# Patient Record
Sex: Female | Born: 2000 | Race: Black or African American | Hispanic: No | Marital: Single | Smoking: Never smoker
Health system: Southern US, Community
[De-identification: ages and names within clinical notes are randomized; demographics above are authoritative.]

## PROBLEM LIST (undated history)

## (undated) DIAGNOSIS — F419 Anxiety disorder, unspecified: Secondary | ICD-10-CM

## (undated) DIAGNOSIS — R519 Headache, unspecified: Secondary | ICD-10-CM

## (undated) DIAGNOSIS — F32A Depression, unspecified: Secondary | ICD-10-CM

## (undated) DIAGNOSIS — R51 Headache: Secondary | ICD-10-CM

## (undated) HISTORY — DX: Anxiety disorder, unspecified: F41.9

## (undated) HISTORY — DX: Depression, unspecified: F32.A

## (undated) HISTORY — PX: NO PAST SURGERIES: SHX2092

---
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
## 2019-01-02 ENCOUNTER — Ambulatory Visit: Payer: Self-pay | Admitting: Ophthalmology

## 2019-01-02 ENCOUNTER — Encounter (HOSPITAL_BASED_OUTPATIENT_CLINIC_OR_DEPARTMENT_OTHER)
Admission: RE | Admit: 2019-01-02 | Discharge: 2019-01-02 | Disposition: A | Discharge status: Home / Self Care | Payer: No Typology Code available for payment source | Source: Ambulatory Visit | Attending: Ophthalmology | Admitting: Ophthalmology

## 2019-01-02 DIAGNOSIS — H0015 Chalazion left lower eyelid: Secondary | ICD-10-CM | POA: Diagnosis present

## 2019-01-02 DIAGNOSIS — Z01812 Encounter for preprocedural laboratory examination: Secondary | ICD-10-CM

## 2019-01-02 NOTE — H&P (Signed)
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 Headache     Pertinent ocular history: 18 y.o. female with chalazion left eye x24mos. Has not resolved with warm compresses or topical antibiotics. Patient able to resolve other chalaziae but not this one. Strongly wishes for excision but refuses to have done in clinic, extremely anxious about needles and would "not be able to hold still" for numbing.  Pertinent family history:  Family History  Problem Relation Age of Onset  . Migraines Mother   . Headache Father   . Alcohol abuse Maternal Grandmother   . Alcohol abuse Paternal Grandfather   . Seizures Neg Hx   . Depression Neg Hx   . Anxiety disorder Neg Hx   . Bipolar disorder Neg Hx   . Schizophrenia Neg Hx   . ADD / ADHD Neg Hx   . Autism Neg Hx   . Drug abuse Neg Hx     General:  Healthy appearing patient in no distress.   Eyes:    Acuity OD 20/15  OS 20/20   [place]  External: Within normal limits     Anterior segment: 75mm hard nodule on left lower eyelid  Motility: Full EOMs OU  Fundus: Normal         Impression: 18 y.o. female with chalazion left eye resistant to medical management  Plan: Chalazion excision left eye  French Ana

## 2019-01-03 LAB — POCT PREGNANCY, URINE: Preg Test, Ur: NEGATIVE

## 2019-01-04 NOTE — Progress Notes (Signed)
Anesthesia consult per Dr. Carignan, will proceed with surgery as scheduled. 

## 2019-01-05 ENCOUNTER — Encounter (HOSPITAL_BASED_OUTPATIENT_CLINIC_OR_DEPARTMENT_OTHER): Payer: Self-pay

## 2019-01-05 ENCOUNTER — Ambulatory Visit (HOSPITAL_BASED_OUTPATIENT_CLINIC_OR_DEPARTMENT_OTHER): Payer: No Typology Code available for payment source | Admitting: Anesthesiology

## 2019-01-05 ENCOUNTER — Other Ambulatory Visit: Payer: Self-pay

## 2019-01-05 ENCOUNTER — Encounter (HOSPITAL_BASED_OUTPATIENT_CLINIC_OR_DEPARTMENT_OTHER)
Admission: RE | Disposition: A | Discharge status: Home / Self Care | Payer: Self-pay | Source: Home / Self Care | Attending: Ophthalmology

## 2019-01-05 ENCOUNTER — Ambulatory Visit (HOSPITAL_BASED_OUTPATIENT_CLINIC_OR_DEPARTMENT_OTHER)
Admission: RE | Admit: 2019-01-05 | Discharge: 2019-01-05 | Disposition: A | Discharge status: Home / Self Care | Payer: No Typology Code available for payment source | Attending: Ophthalmology | Admitting: Ophthalmology

## 2019-01-05 DIAGNOSIS — H0015 Chalazion left lower eyelid: Secondary | ICD-10-CM | POA: Diagnosis not present

## 2019-01-05 HISTORY — DX: Headache, unspecified: R51.9

## 2019-01-05 HISTORY — DX: Headache: R51

## 2019-01-05 HISTORY — PX: CHALAZION EXCISION: SHX213

## 2019-01-05 SURGERY — EXCISION, CHALAZION
Anesthesia: General | Site: Eye | Laterality: Left

## 2019-01-05 MED ORDER — TRIAMCINOLONE ACETONIDE 40 MG/ML IJ SUSP
INTRAMUSCULAR | Status: AC
  Filled 2019-01-05: qty 5

## 2019-01-05 MED ORDER — MIDAZOLAM HCL 2 MG/2ML IJ SOLN: 1.0000 mg | INTRAMUSCULAR | Status: DC | PRN

## 2019-01-05 MED ORDER — LIDOCAINE 2% (20 MG/ML) 5 ML SYRINGE
INTRAMUSCULAR | Status: AC
  Filled 2019-01-05: qty 25

## 2019-01-05 MED ORDER — NEOMYCIN-POLYMYXIN-DEXAMETH 0.1 % OP OINT: 1.0000 "application " | TOPICAL_OINTMENT | Freq: Three times a day (TID) | OPHTHALMIC | Status: DC

## 2019-01-05 MED ORDER — FENTANYL CITRATE (PF) 100 MCG/2ML IJ SOLN
INTRAMUSCULAR | Status: AC
  Filled 2019-01-05: qty 2

## 2019-01-05 MED ORDER — FENTANYL CITRATE (PF) 100 MCG/2ML IJ SOLN
50.0000 ug | INTRAMUSCULAR | Status: DC | PRN
  Administered 2019-01-05: 25 ug via INTRAVENOUS

## 2019-01-05 MED ORDER — LIDOCAINE-EPINEPHRINE 1 %-1:100000 IJ SOLN
INTRAMUSCULAR | Status: AC
  Filled 2019-01-05: qty 2

## 2019-01-05 MED ORDER — PROPOFOL 500 MG/50ML IV EMUL
INTRAVENOUS | Status: AC
  Filled 2019-01-05: qty 50

## 2019-01-05 MED ORDER — PROPOFOL 10 MG/ML IV BOLUS
INTRAVENOUS | Status: DC | PRN
  Administered 2019-01-05: 150 mg via INTRAVENOUS

## 2019-01-05 MED ORDER — NEOMYCIN-POLYMYXIN-DEXAMETH 3.5-10000-0.1 OP OINT
TOPICAL_OINTMENT | OPHTHALMIC | Status: DC | PRN
  Administered 2019-01-05: 1 via OPHTHALMIC

## 2019-01-05 MED ORDER — SCOPOLAMINE 1 MG/3DAYS TD PT72: 1.0000 | MEDICATED_PATCH | Freq: Once | TRANSDERMAL | Status: DC | PRN

## 2019-01-05 MED ORDER — BSS IO SOLN
INTRAOCULAR | Status: AC
  Filled 2019-01-05: qty 30

## 2019-01-05 MED ORDER — LIDOCAINE-EPINEPHRINE 1 %-1:100000 IJ SOLN
INTRAMUSCULAR | Status: DC | PRN
  Administered 2019-01-05: 1 mL

## 2019-01-05 MED ORDER — NEOMYCIN-POLYMYXIN-DEXAMETH 3.5-10000-0.1 OP OINT
TOPICAL_OINTMENT | OPHTHALMIC | Status: AC
  Filled 2019-01-05: qty 7

## 2019-01-05 MED ORDER — LACTATED RINGERS IV SOLN
INTRAVENOUS | Status: DC
  Administered 2019-01-05: 09:00:00 via INTRAVENOUS

## 2019-01-05 MED ORDER — LIDOCAINE HCL (CARDIAC) PF 100 MG/5ML IV SOSY
PREFILLED_SYRINGE | INTRAVENOUS | Status: DC | PRN
  Administered 2019-01-05: 30 mg via INTRAVENOUS

## 2019-01-05 MED ORDER — ONDANSETRON HCL 4 MG/2ML IJ SOLN
INTRAMUSCULAR | Status: DC | PRN
  Administered 2019-01-05: 4 mg via INTRAVENOUS

## 2019-01-05 MED ORDER — DEXAMETHASONE SODIUM PHOSPHATE 10 MG/ML IJ SOLN
INTRAMUSCULAR | Status: AC
  Filled 2019-01-05: qty 3

## 2019-01-05 MED ORDER — DEXAMETHASONE SODIUM PHOSPHATE 4 MG/ML IJ SOLN
INTRAMUSCULAR | Status: DC | PRN
  Administered 2019-01-05: 4 mg via INTRAVENOUS

## 2019-01-05 MED ORDER — BSS IO SOLN
INTRAOCULAR | Status: DC | PRN
  Administered 2019-01-05: 2 mL

## 2019-01-05 MED ORDER — ONDANSETRON HCL 4 MG/2ML IJ SOLN
INTRAMUSCULAR | Status: AC
  Filled 2019-01-05: qty 12

## 2019-01-05 SURGICAL SUPPLY — 33 items
APL SRG 3 HI ABS STRL LF PLS (MISCELLANEOUS) ×1
APPLICATOR DR MATTHEWS STRL (MISCELLANEOUS) ×3 IMPLANT
BLADE SURG 15 STRL LF DISP TIS (BLADE) ×1 IMPLANT
BLADE SURG 15 STRL SS (BLADE) ×3
BNDG COHESIVE 2X5 TAN STRL LF (GAUZE/BANDAGES/DRESSINGS) IMPLANT
CAUTERY EYE LOW TEMP 1300F FIN (OPHTHALMIC RELATED) IMPLANT
CORD BIPOLAR FORCEPS 12FT (ELECTRODE) ×3 IMPLANT
COVER MAYO STAND STRL (DRAPES) ×3 IMPLANT
COVER SURGICAL LIGHT HANDLE (MISCELLANEOUS) ×2 IMPLANT
COVER WAND RF STERILE (DRAPES) IMPLANT
DRAPE EENT ADH APERT 15X15 STR (DRAPES) ×3 IMPLANT
GAUZE SPONGE 4X4 16PLY XRAY LF (GAUZE/BANDAGES/DRESSINGS) ×2 IMPLANT
GLOVE BIO SURGEON STRL SZ 6.5 (GLOVE) ×2 IMPLANT
GLOVE BIO SURGEON STRL SZ7 (GLOVE) ×1 IMPLANT
GLOVE BIO SURGEONS STRL SZ 6.5 (GLOVE) ×1
GLOVE BIOGEL PI IND STRL 8 (GLOVE) IMPLANT
GLOVE BIOGEL PI INDICATOR 8 (GLOVE) ×2
GLOVE SURG SYN 8.0 (GLOVE) ×3 IMPLANT
GLOVE SURG SYN 8.0 PF PI (GLOVE) IMPLANT
MARKER SKIN DUAL TIP RULER LAB (MISCELLANEOUS) ×2 IMPLANT
NDL HYPO 30X.5 LL (NEEDLE) ×1 IMPLANT
NDL PRECISIONGLIDE 27X1.5 (NEEDLE) ×1 IMPLANT
NDL SAFETY ECLIPSE 18X1.5 (NEEDLE) IMPLANT
NEEDLE HYPO 18GX1.5 SHARP (NEEDLE) ×3
NEEDLE HYPO 30X.5 LL (NEEDLE) ×3 IMPLANT
NEEDLE PRECISIONGLIDE 27X1.5 (NEEDLE) IMPLANT
PACK BASIN DAY SURGERY FS (CUSTOM PROCEDURE TRAY) ×1 IMPLANT
PAD ALCOHOL SWAB (MISCELLANEOUS) IMPLANT
SUT CHROMIC 7 0 TG140 8 (SUTURE) IMPLANT
SWABSTICK POVIDONE IODINE SNGL (MISCELLANEOUS) ×6 IMPLANT
SYR CONTROL 10ML LL (SYRINGE) ×3 IMPLANT
SYR TB 1ML LL NO SAFETY (SYRINGE) IMPLANT
TOWEL GREEN STERILE FF (TOWEL DISPOSABLE) ×3 IMPLANT

## 2019-01-05 NOTE — Anesthesia Preprocedure Evaluation (Signed)
Anesthesia Evaluation  Patient identified by MRN, date of birth, ID band Patient awake    Reviewed: Allergy & Precautions, NPO status , Patient's Chart, lab work & pertinent test results  Airway Mallampati: II  TM Distance: >3 FB Neck ROM: Full    Dental  (+) Teeth Intact, Dental Advisory Given   Pulmonary    breath sounds clear to auscultation       Cardiovascular  Rhythm:Regular Rate:Normal     Neuro/Psych    GI/Hepatic   Endo/Other    Renal/GU      Musculoskeletal   Abdominal   Peds  Hematology   Anesthesia Other Findings   Reproductive/Obstetrics                             Anesthesia Physical Anesthesia Plan  ASA: I  Anesthesia Plan: General   Post-op Pain Management:    Induction: Intravenous  PONV Risk Score and Plan: Ondansetron and Dexamethasone  Airway Management Planned: LMA  Additional Equipment:   Intra-op Plan:   Post-operative Plan:   Informed Consent: I have reviewed the patients History and Physical, chart, labs and discussed the procedure including the risks, benefits and alternatives for the proposed anesthesia with the patient or authorized representative who has indicated his/her understanding and acceptance.   Dental advisory given  Plan Discussed with: CRNA and Anesthesiologist  Anesthesia Plan Comments:         Anesthesia Quick Evaluation  

## 2019-01-05 NOTE — Anesthesia Procedure Notes (Signed)
Procedure Name: General with mask airway Date/Time: 01/05/2019 9:43 AM Performed by: Sheryn Bison, CRNA Pre-anesthesia Checklist: Timeout performed, Patient being monitored, Suction available, Emergency Drugs available and Patient identified Patient Re-evaluated:Patient Re-evaluated prior to induction

## 2019-01-05 NOTE — Discharge Instructions (Signed)
No swimming for 1 week. It is okay to let water run over the face and eyes when showering or taking a bath, even during the first week.  No other restrictions on activity. There may be slightly bloody tears for the first day.   Use antibiotic eye ointment, 1/2 inch in operated eye(s) three times per day for one week.  Use ibuprofen as needed for pain. Dose per package instructions.  Ice packs for the first two days and warm packs for the next four to decrease swelling if desired.  Call Dr. Eliane Decree office [phone]) next Thursday to report progress. Call sooner if there are any problems.  Postoperative Anesthesia Instructions-Pediatric  Activity: Your child should rest for the remainder of the day. A responsible individual must stay with your child for 24 hours.  Meals: Your child should start with liquids and light foods such as gelatin or soup unless otherwise instructed by the physician. Progress to regular foods as tolerated. Avoid spicy, greasy, and heavy foods. If nausea and/or vomiting occur, drink only clear liquids such as apple juice or Pedialyte until the nausea and/or vomiting subsides. Call your physician if vomiting continues.  Special Instructions/Symptoms: Your child may be drowsy for the rest of the day, although some children experience some hyperactivity a few hours after the surgery. Your child may also experience some irritability or crying episodes due to the operative procedure and/or anesthesia. Your child's throat may feel dry or sore from the anesthesia or the breathing tube placed in the throat during surgery. Use throat lozenges, sprays, or ice chips if needed.

## 2019-01-05 NOTE — Anesthesia Postprocedure Evaluation (Signed)
Anesthesia Post Note  Patient: Candice Howard  Procedure(s) Performed: EXCISION CHALAZION LEFT LOWER LID (Left Eye)     Patient location during evaluation: PACU Anesthesia Type: General Level of consciousness: awake and alert Pain management: pain level controlled Vital Signs Assessment: post-procedure vital signs reviewed and stable Respiratory status: spontaneous breathing, nonlabored ventilation, respiratory function stable and patient connected to nasal cannula oxygen Cardiovascular status: blood pressure returned to baseline and stable Postop Assessment: no apparent nausea or vomiting Anesthetic complications: no    Last Vitals:  Vitals:   01/05/19 1015 01/05/19 1038  BP:    Pulse:  65  Resp: 18 18  Temp:  37.2 C  SpO2: 100% 100%    Last Pain:  Vitals:   01/05/19 1038  TempSrc:   PainSc: 0-No pain                 Sander Remedios COKER

## 2019-01-05 NOTE — Transfer of Care (Signed)
Immediate Anesthesia Transfer of Care Note  Patient: Candice Howard  Procedure(s) Performed: EXCISION CHALAZION LEFT LOWER LID (Left Eye)  Patient Location: PACU  Anesthesia Type:General  Level of Consciousness: awake, alert , oriented and patient cooperative  Airway & Oxygen Therapy: Patient Spontanous Breathing and Patient connected to face mask oxygen  Post-op Assessment: Report given to RN and Post -op Vital signs reviewed and stable  Post vital signs: Reviewed and stable  Last Vitals:  Vitals Value Taken Time  BP    Temp    Pulse 87 01/05/2019  9:53 AM  Resp 15 01/05/2019  9:53 AM  SpO2 100 % 01/05/2019  9:53 AM  Vitals shown include unvalidated device data.  Last Pain:  Vitals:   01/05/19 0841  TempSrc: Oral  PainSc: 0-No pain         Complications: No apparent anesthesia complications

## 2019-01-05 NOTE — H&P (Signed)
Interval History and Physical Examination:  Candice Howard  01/05/2019  Date of Initial H&P: 12/27/18   The patient has been reexamined and the H&P has been reviewed. The patient has no new complaints. The indications for today's procedure remain valid.  There is no change in the plan of care. There are no medical contraindications for proceeding with today's surgery and we will go forward as planned.  Candice Bridge PatelMD

## 2019-01-05 NOTE — Op Note (Signed)
01/05/2019  9:48 AM  PATIENT:  Candice Howard  18 y.o. female  Preoperative diagnosis:  Chalazion, left eye  Postoperative diagnosis:  Same  Procedure:  1.  Chalazion excision, left eye  Surgeon:  French Ana  Anesthesia:  General (laryngeal mask)  Complications:  None  Description of procedure:  After routine preoperative evaluation including informed consent from the parent, the patient was taken to the operating room where She was identified by me. Time out was performed by staff and all present in the room were in agreement. General anesthesia was induced without difficulty after placement of appropriate monitors. The left eye was prepped and draped with blue towels in the usual sterile ophthalmic fashion.  The eyelids of the left eye were thoroughly inspected. A chalazion was identified on the lower eyelid of the left eye. A chalazion clamp was placed over the lesion taking care to prevent contact with the corneal epithelium; the clamp was tightened and the eyelid everted.  A #15 blade on a handle was used to incise the chalazion on the posterior surface. Cotton tip applicators were used to gently expulse the inner contents of the chalazion. A chalazion scoop was used to break the adhesions within the chalazion and further encourage expulsion of contents.  Once the contents were satisfactorily removed, the incision was cleaned with cotton tip applicators. The chalazion clamp was slowly released and bipolar cautery was used as needed to achieve satisfactory hemostasis of the wound. An injection of 0.38mL of lidocaine with epinephrine 1:100,000 was used for maintenance of hemostasis and perioperative anesthesia.  Maxitrol eye ointment was placed in the operative eye. The patient was awakened without difficulty and taken to the recovery room in stable condition, having suffered no intraoperative or immediate postoperative complications.  The patient is to use Maxitrol eye ointment  in the operative eye three times daily for one week. The patient is to call my office for followup by phone in one week and sooner if any concerns arise.  Despina Hidden, MD

## 2019-01-08 ENCOUNTER — Encounter (HOSPITAL_BASED_OUTPATIENT_CLINIC_OR_DEPARTMENT_OTHER): Payer: Self-pay | Admitting: Ophthalmology

## 2019-01-25 ENCOUNTER — Emergency Department (HOSPITAL_COMMUNITY)
Admission: EM | Admit: 2019-01-25 | Discharge: 2019-01-25 | Disposition: A | Discharge status: Home / Self Care | Payer: No Typology Code available for payment source | Attending: Emergency Medicine | Admitting: Emergency Medicine

## 2019-01-25 ENCOUNTER — Other Ambulatory Visit: Payer: Self-pay

## 2019-01-25 ENCOUNTER — Encounter (HOSPITAL_COMMUNITY): Payer: Self-pay | Admitting: *Deleted

## 2019-01-25 DIAGNOSIS — R05 Cough: Secondary | ICD-10-CM | POA: Diagnosis present

## 2019-01-25 DIAGNOSIS — Z7722 Contact with and (suspected) exposure to environmental tobacco smoke (acute) (chronic): Secondary | ICD-10-CM | POA: Diagnosis not present

## 2019-01-25 DIAGNOSIS — J069 Acute upper respiratory infection, unspecified: Secondary | ICD-10-CM | POA: Insufficient documentation

## 2019-01-25 NOTE — ED Notes (Signed)
Bed: [facility]PT4 Expected date:  Expected time:  Means of arrival:  Comments: 

## 2019-01-25 NOTE — ED Provider Notes (Signed)
[facility]   CSN: [id number] Arrival date & time: 01/25/19  1540    History   Chief Complaint Chief Complaint  Patient presents with  . Medical Evaluation    HPI Candice Howard is a 18 y.o. female.     This is a 18 year old female who presents requesting testing for possible covid exposure.  Patient had 1 day of scratchy throat with nonproductive cough.  No fever or chills.  She has not been short of breath.  Did travel to [place].  No vomiting or diarrhea.  Is here at the urging of family.     Past Medical History:  Diagnosis Date  . Headache     Patient Active Problem List   Diagnosis Date Noted  . Chronic daily headache 08/09/2016  . Severe single current episode of major depressive disorder, without psychotic features (HCC) 08/09/2016  . Anxiety state 08/09/2016  . Sleep disorder 08/09/2016    Past Surgical History:  Procedure Laterality Date  . CHALAZION EXCISION Left 01/05/2019   Procedure: EXCISION CHALAZION LEFT LOWER LID;  Surgeon: French Ana, MD;  Location: [facility];  Service: Ophthalmology;  Laterality: Left;  . NO PAST SURGERIES       OB History   No obstetric history on file.      Home Medications    Prior to Admission medications   Medication Sig Start Date End Date Taking? Authorizing Provider  neomycin-polymyxin-dexameth (MAXITROL) 0.1 % OINT Place 1 application into the left eye 3 (three) times daily. For one week 01/05/19   French Ana, MD    Family History Family History  Problem Relation Age of Onset  . Migraines Mother   . Headache Father   . Alcohol abuse Maternal Grandmother   . Alcohol abuse Paternal Grandfather   . Seizures Neg Hx   . Depression Neg Hx   . Anxiety disorder Neg Hx   . Bipolar disorder Neg Hx   . Schizophrenia Neg Hx   . ADD / ADHD Neg Hx   . Autism Neg Hx   . Drug abuse Neg Hx     Social History Social History   Tobacco Use   . Smoking status: Passive Smoke Exposure - Never Smoker  . Smokeless tobacco: Never Used  . Tobacco comment: Mother smokes outside  Substance Use Topics  . Alcohol use: Never    Frequency: Never  . Drug use: Never     Allergies   Patient has no known allergies.   Review of Systems Review of Systems  All other systems reviewed and are negative.    Physical Exam Updated Vital Signs BP 122/80 (BP Location: Right Arm)   Pulse 82   Temp 98.6 F (37 C) (Oral)   Resp 16   LMP 01/16/2019   SpO2 100%   Physical Exam Vitals signs and nursing note reviewed.  Constitutional:      General: She is not in acute distress.    Appearance: Normal appearance. She is well-developed. She is not toxic-appearing.  HENT:     Head: Normocephalic and atraumatic.  Eyes:     General: Lids are normal.     Conjunctiva/sclera: Conjunctivae normal.     Pupils: Pupils are equal, round, and reactive to light.  Neck:     Musculoskeletal: Normal range of motion and neck supple.     Thyroid: No thyroid mass.     Trachea: No tracheal deviation.  Cardiovascular:     Rate  and Rhythm: Normal rate and regular rhythm.     Heart sounds: Normal heart sounds. No murmur. No gallop.   Pulmonary:     Effort: Pulmonary effort is normal. No respiratory distress.     Breath sounds: Normal breath sounds. No stridor. No decreased breath sounds, wheezing, rhonchi or rales.  Abdominal:     General: Bowel sounds are normal. There is no distension.     Palpations: Abdomen is soft.     Tenderness: There is no abdominal tenderness. There is no rebound.  Musculoskeletal: Normal range of motion.        General: No tenderness.  Skin:    General: Skin is warm and dry.     Findings: No abrasion or rash.  Neurological:     Mental Status: She is alert and oriented to person, place, and time.     GCS: GCS eye subscore is 4. GCS verbal subscore is 5. GCS motor subscore is 6.     Cranial Nerves: No cranial nerve deficit.      Sensory: No sensory deficit.  Psychiatric:        Speech: Speech normal.        Behavior: Behavior normal.      ED Treatments / Results  Labs (all labs ordered are listed, but only abnormal results are displayed) Labs Reviewed - No data to display  EKG None  Radiology No results found.  Procedures Procedures (including critical care time)  Medications Ordered in ED Medications - No data to display   Initial Impression / Assessment and Plan / ED Course  I have reviewed the triage vital signs and the nursing notes.  Pertinent labs & imaging results that were available during my care of the patient were reviewed by me and considered in my medical decision making (see chart for details).        Patient's exam is reassuring here.  No indication for testing.  Stable for discharge with return precautions.  Final Clinical Impressions(s) / ED Diagnoses   Final diagnoses:  Upper respiratory tract infection, unspecified type    ED Discharge Orders    None       Lorre Nick, MD 01/25/19 1657
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
## 2019-12-19 ENCOUNTER — Encounter (HOSPITAL_COMMUNITY): Payer: Self-pay | Admitting: Emergency Medicine

## 2019-12-19 ENCOUNTER — Emergency Department (HOSPITAL_COMMUNITY)
Admission: EM | Admit: 2019-12-19 | Discharge: 2019-12-19 | Disposition: A | Discharge status: Home / Self Care | Payer: No Typology Code available for payment source | Attending: Emergency Medicine | Admitting: Emergency Medicine

## 2019-12-19 ENCOUNTER — Other Ambulatory Visit: Payer: Self-pay

## 2019-12-19 DIAGNOSIS — Z7722 Contact with and (suspected) exposure to environmental tobacco smoke (acute) (chronic): Secondary | ICD-10-CM | POA: Insufficient documentation

## 2019-12-19 DIAGNOSIS — M545 Low back pain: Secondary | ICD-10-CM | POA: Diagnosis present

## 2019-12-19 DIAGNOSIS — L0231 Cutaneous abscess of buttock: Secondary | ICD-10-CM | POA: Diagnosis not present

## 2019-12-19 DIAGNOSIS — L0291 Cutaneous abscess, unspecified: Secondary | ICD-10-CM

## 2019-12-19 MED ORDER — CEPHALEXIN 500 MG PO CAPS: ORAL_CAPSULE | ORAL | 0 refills | Status: DC

## 2019-12-19 MED ORDER — ONDANSETRON 4 MG PO TBDP
4.0000 mg | ORAL_TABLET | Freq: Once | ORAL | Status: AC
  Administered 2019-12-19: 03:00:00 4 mg via ORAL
  Filled 2019-12-19: qty 1

## 2019-12-19 MED ORDER — OXYCODONE-ACETAMINOPHEN 5-325 MG PO TABS
1.0000 | ORAL_TABLET | Freq: Once | ORAL | Status: AC
  Administered 2019-12-19: 03:00:00 1 via ORAL
  Filled 2019-12-19: qty 1

## 2019-12-19 MED ORDER — ONDANSETRON HCL 4 MG PO TABS: 4.0000 mg | ORAL_TABLET | Freq: Three times a day (TID) | ORAL | 0 refills | Status: DC | PRN

## 2019-12-19 MED ORDER — OXYCODONE-ACETAMINOPHEN 5-325 MG PO TABS: 1.0000 | ORAL_TABLET | Freq: Three times a day (TID) | ORAL | 0 refills | Status: DC | PRN

## 2019-12-19 NOTE — ED Triage Notes (Signed)
Patient here from home reporting spider bite to left buttocks x3 days.

## 2019-12-19 NOTE — ED Provider Notes (Signed)
[facility]-EMERGENCY DEPT Provider Note   CSN: [id number] Arrival date & time: 12/19/19  0101     History Chief Complaint  Patient presents with  . Insect Bite    Candice Howard is a 19 y.o. female who presents emergency department chief complaint of left buttock pain.  Patient states that she either has a boil or was bitten by a spider.  She has had it for 3 days, worsening pain.  She has been using Epsom salt soaks without relief.  She states it is extremely painful and she was unable to sleep.  Denies fevers, chills.  HPI     Past Medical History:  Diagnosis Date  . Headache     Patient Active Problem List   Diagnosis Date Noted  . Chronic daily headache 08/09/2016  . Severe single current episode of major depressive disorder, without psychotic features (HCC) 08/09/2016  . Anxiety state 08/09/2016  . Sleep disorder 08/09/2016    Past Surgical History:  Procedure Laterality Date  . CHALAZION EXCISION Left 01/05/2019   Procedure: EXCISION CHALAZION LEFT LOWER LID;  Surgeon: French Ana, MD;  Location: [facility];  Service: Ophthalmology;  Laterality: Left;  . NO PAST SURGERIES       OB History   No obstetric history on file.     Family History  Problem Relation Age of Onset  . Migraines Mother   . Headache Father   . Alcohol abuse Maternal Grandmother   . Alcohol abuse Paternal Grandfather   . Seizures Neg Hx   . Depression Neg Hx   . Anxiety disorder Neg Hx   . Bipolar disorder Neg Hx   . Schizophrenia Neg Hx   . ADD / ADHD Neg Hx   . Autism Neg Hx   . Drug abuse Neg Hx     Social History   Tobacco Use  . Smoking status: Passive Smoke Exposure - Never Smoker  . Smokeless tobacco: Never Used  . Tobacco comment: Mother smokes outside  Substance Use Topics  . Alcohol use: Never  . Drug use: Never    Home Medications Prior to Admission medications   Medication Sig Start Date End Date Taking? Authorizing  Provider  neomycin-polymyxin-dexameth (MAXITROL) 0.1 % OINT Place 1 application into the left eye 3 (three) times daily. For one week 01/05/19   French Ana, MD    Allergies    Patient has no known allergies.  Review of Systems   Review of Systems Ten systems reviewed and are negative for acute change, except as noted in the HPI.   Physical Exam Updated Vital Signs BP (!) 100/59   Pulse 78   Temp 98.3 F (36.8 C) (Oral)   Resp 16   Ht 5\' 3"  (1.6 m)   Wt 59.9 kg   SpO2 98%   BMI 23.38 kg/m   Physical Exam Vitals and nursing note reviewed.  Constitutional:      General: She is not in acute distress.    Appearance: She is well-developed. She is not diaphoretic.  HENT:     Head: Normocephalic and atraumatic.  Eyes:     General: No scleral icterus.    Conjunctiva/sclera: Conjunctivae normal.  Cardiovascular:     Rate and Rhythm: Normal rate and regular rhythm.     Heart sounds: Normal heart sounds. No murmur. No friction rub. No gallop.   Pulmonary:     Effort: Pulmonary effort is normal. No respiratory distress.  Breath sounds: Normal breath sounds.  Abdominal:     General: Bowel sounds are normal. There is no distension.     Palpations: Abdomen is soft. There is no mass.     Tenderness: There is no abdominal tenderness. There is no guarding.  Musculoskeletal:     Cervical back: Normal range of motion.  Skin:    General: Skin is warm and dry.     Comments: 6 cm area of erythema, swelling with central fluctuance.  Neurological:     Mental Status: She is alert and oriented to person, place, and time.  Psychiatric:        Behavior: Behavior normal.     ED Results / Procedures / Treatments   Labs (all labs ordered are listed, but only abnormal results are displayed) Labs Reviewed - No data to display  EKG None  Radiology No results found.  Procedures Ultrasound ED Soft Tissue  Date/Time: 12/19/2019 3:32 AM Performed by: Margarita Mail, PA-C  Authorized by: Margarita Mail, PA-C   Procedure details:    Indications: localization of abscess     Transverse view:  Visualized   Longitudinal view:  Visualized   Images: not archived   Location:    Location: buttocks     Side:  Left Findings:     abscess present   (including critical care time)  Medications Ordered in ED Medications - No data to display  ED Course  I have reviewed the triage vital signs and the nursing notes.  Pertinent labs & imaging results that were available during my care of the patient were reviewed by me and considered in my medical decision making (see chart for details).    MDM Rules/Calculators/A&P                      Patient with abscess of the left buttock.  Patient declines incision and drainage today.  Discussed options of care.  At this point she would like to go home with something for pain, and antibiotic.  I have also advised the patient to purchase ichthammol ointment which may help draw to ahead.  She can continue with warm soaks.  She understands that if her symptoms are worsening including fevers, chills, worsening pain, streaking from the abscess she needs to return to the emergency department for incision and drainage.  She appears otherwise appropriate for discharge at this time with watchful waiting. Final Clinical Impression(s) / ED Diagnoses Final diagnoses:  None    Rx / DC Orders ED Discharge Orders    None       Margarita Mail, PA-C 37/85/88 [id number]    Delora Fuel, MD 74/12/87 [phone]

## 2019-12-19 NOTE — ED Notes (Signed)
Pt states that she wants to leave, I told her that a provider just picked up her case. She states that she is angry and that she could be at home in pain. I went to check and see if a provider could come see her sooner, and Abigail, PA, walked into the room before I could even ask her. She is with the patient at bedside.

## 2019-12-19 NOTE — Discharge Instructions (Addendum)
Ichthammol ointment (drawing salve) use as directed Contact a health care provider if you have: More redness, swelling, or pain around your abscess. More fluid or blood coming from your abscess. Warm skin around your abscess. More pus or a bad smell coming from your abscess. A fever. Muscle aches. Chills or a general ill feeling. Get help right away if you: Have severe pain. See red streaks on your skin spreading away from the abscess.

## 2020-06-14 ENCOUNTER — Encounter (HOSPITAL_COMMUNITY): Payer: Self-pay | Admitting: Emergency Medicine

## 2020-06-14 ENCOUNTER — Emergency Department (HOSPITAL_COMMUNITY)
Admission: EM | Admit: 2020-06-14 | Discharge: 2020-06-14 | Disposition: A | Discharge status: Home / Self Care | Payer: Medicaid Other | Attending: Emergency Medicine | Admitting: Emergency Medicine

## 2020-06-14 ENCOUNTER — Other Ambulatory Visit: Payer: Self-pay

## 2020-06-14 DIAGNOSIS — Z5321 Procedure and treatment not carried out due to patient leaving prior to being seen by health care provider: Secondary | ICD-10-CM | POA: Insufficient documentation

## 2020-06-14 DIAGNOSIS — R21 Rash and other nonspecific skin eruption: Secondary | ICD-10-CM | POA: Diagnosis not present

## 2020-06-14 NOTE — ED Triage Notes (Signed)
Pt c/o rash to right forearm, reports hx of stress hives.

## 2020-11-08 ENCOUNTER — Other Ambulatory Visit: Payer: Self-pay

## 2020-11-08 ENCOUNTER — Ambulatory Visit
Admission: EM | Admit: 2020-11-08 | Discharge: 2020-11-08 | Disposition: A | Discharge status: Home / Self Care | Payer: Medicaid Other

## 2020-11-08 DIAGNOSIS — J069 Acute upper respiratory infection, unspecified: Secondary | ICD-10-CM

## 2020-11-08 DIAGNOSIS — B009 Herpesviral infection, unspecified: Secondary | ICD-10-CM

## 2020-11-08 HISTORY — DX: Herpesviral infection, unspecified: B00.9

## 2020-11-08 MED ORDER — FLUTICASONE PROPIONATE 50 MCG/ACT NA SUSP: 1.0000 | Freq: Every day | NASAL | 0 refills | Status: DC

## 2020-11-08 MED ORDER — BENZONATATE 100 MG PO CAPS: 100.0000 mg | ORAL_CAPSULE | Freq: Three times a day (TID) | ORAL | 0 refills | Status: DC

## 2020-11-08 MED ORDER — CETIRIZINE HCL 10 MG PO TABS: 10.0000 mg | ORAL_TABLET | Freq: Every day | ORAL | 0 refills | Status: DC

## 2020-11-08 NOTE — ED Triage Notes (Signed)
Pt is here with congestion and a cough that started Tuesday, pt has taken OTC meds to relieve discomfort. Pt had a COVID test yesterday and is just in need of treatment options today.

## 2020-11-08 NOTE — Discharge Instructions (Addendum)

## 2020-11-08 NOTE — ED Provider Notes (Signed)
[facility]    CSN: [id number] Arrival date & time: 11/08/20  1209      History   Chief Complaint Chief Complaint  Patient presents with  . Cough  . Nasal Congestion    HPI Candice Howard is a 20 y.o. female  History was provided by the patient. Candice Howard is a 20 y.o. female who presents for evaluation of symptoms of a URI. Symptoms include dry cough, nasal blockage, post nasal drip, sinus and nasal congestion and sore throat. Onset of symptoms was 5 days ago, unchanged since that time. Associated symptoms include no  fever.  She is drinking plenty of fluids. Evaluation to date: covid PCR pending. Treatment to date: none The following portions of the patient's history were reviewed and updated as appropriate: allergies, current medications, past family history, past medical history, past social history, past surgical history and problem list.     Past Medical History:  Diagnosis Date  . Headache     Patient Active Problem List   Diagnosis Date Noted  . Chronic daily headache 08/09/2016  . Severe single current episode of major depressive disorder, without psychotic features (HCC) 08/09/2016  . Anxiety state 08/09/2016  . Sleep disorder 08/09/2016    Past Surgical History:  Procedure Laterality Date  . CHALAZION EXCISION Left 01/05/2019   Procedure: EXCISION CHALAZION LEFT LOWER LID;  Surgeon: French Ana, MD;  Location: [facility];  Service: Ophthalmology;  Laterality: Left;  . NO PAST SURGERIES      OB History   No obstetric history on file.      Home Medications    Prior to Admission medications   Medication Sig Start Date End Date Taking? Authorizing Provider  benzonatate (TESSALON) 100 MG capsule Take 1 capsule (100 mg total) by mouth every 8 (eight) hours. 11/08/20  Yes Hall-Potvin, [place], PA-C  cetirizine (ZYRTEC ALLERGY) 10 MG tablet Take 1 tablet (10 mg total) by mouth daily. 11/08/20  Yes Hall-Potvin,  [place], PA-C  fluticasone (FLONASE) 50 MCG/ACT nasal spray Place 1 spray into both nostrils daily. 11/08/20  Yes Hall-Potvin, [place], PA-C  ibuprofen (ADVIL) 200 MG tablet Take by mouth.    [provider]    Family History Family History  Problem Relation Age of Onset  . Migraines Mother   . Headache Father   . Alcohol abuse Maternal Grandmother   . Alcohol abuse Paternal Grandfather   . Seizures Neg Hx   . Depression Neg Hx   . Anxiety disorder Neg Hx   . Bipolar disorder Neg Hx   . Schizophrenia Neg Hx   . ADD / ADHD Neg Hx   . Autism Neg Hx   . Drug abuse Neg Hx     Social History Social History   Tobacco Use  . Smoking status: Passive Smoke Exposure - Never Smoker  . Smokeless tobacco: Never Used  . Tobacco comment: Mother smokes outside  Vaping Use  . Vaping Use: Never used     Allergies   Patient has no known allergies.   Review of Systems Review of Systems  Constitutional: Negative for fatigue and fever.  HENT: Positive for congestion. Negative for dental problem, ear pain, facial swelling, hearing loss, sinus pain, sore throat, trouble swallowing and voice change.   Eyes: Negative for photophobia, pain and visual disturbance.  Respiratory: Positive for choking. Negative for cough and shortness of breath.   Cardiovascular: Negative for chest pain and palpitations.  Gastrointestinal: Negative for diarrhea and  vomiting.  Musculoskeletal: Negative for arthralgias and myalgias.  Neurological: Negative for dizziness and headaches.     Physical Exam Triage Vital Signs ED Triage Vitals  Enc Vitals Group     BP 11/08/20 1255 105/72     Pulse Rate 11/08/20 1255 90     Resp 11/08/20 1255 18     Temp 11/08/20 1255 98.4 F (36.9 C)     Temp Source 11/08/20 1255 Oral     SpO2 11/08/20 1255 96 %     Weight --      Height --      Head Circumference --      Peak Flow --      Pain Score 11/08/20 1252 0     Pain Loc --      Pain Edu? --      Excl.  in [place]? --    No data found.  Updated Vital Signs BP 105/72 (BP Location: Left Arm)   Pulse 90   Temp 98.4 F (36.9 C) (Oral)   Resp 18   LMP 10/26/2020   SpO2 96%   Visual Acuity Right Eye Distance:   Left Eye Distance:   Bilateral Distance:    Right Eye Near:   Left Eye Near:    Bilateral Near:     Physical Exam Constitutional:      General: She is not in acute distress.    Appearance: She is not ill-appearing or diaphoretic.  HENT:     Head: Normocephalic and atraumatic.     Right Ear: Tympanic membrane and ear canal normal.     Left Ear: Tympanic membrane and ear canal normal.     Mouth/Throat:     Mouth: Mucous membranes are moist.     Pharynx: Oropharynx is clear. No oropharyngeal exudate or posterior oropharyngeal erythema.  Eyes:     General: No scleral icterus.    Conjunctiva/sclera: Conjunctivae normal.     Pupils: Pupils are equal, round, and reactive to light.  Neck:     Comments: Trachea midline, negative JVD Cardiovascular:     Rate and Rhythm: Normal rate and regular rhythm.     Heart sounds: No murmur heard. No gallop.   Pulmonary:     Effort: Pulmonary effort is normal. No respiratory distress.     Breath sounds: No wheezing, rhonchi or rales.  Musculoskeletal:     Cervical back: Neck supple. No tenderness.  Lymphadenopathy:     Cervical: No cervical adenopathy.  Skin:    Capillary Refill: Capillary refill takes less than 2 seconds.     Coloration: Skin is not jaundiced or pale.     Findings: No rash.  Neurological:     General: No focal deficit present.     Mental Status: She is alert and oriented to person, place, and time.      UC Treatments / Results  Labs (all labs ordered are listed, but only abnormal results are displayed) Labs Reviewed - No data to display  EKG   Radiology No results found.  Procedures Procedures (including critical care time)  Medications Ordered in UC Medications - No data to display  Initial  Impression / Assessment and Plan / UC Course  I have reviewed the triage vital signs and the nursing notes.  Pertinent labs & imaging results that were available during my care of the patient were reviewed by me and considered in my medical decision making (see chart for details).     Supportive care as below.  Return precautions discussed, pt verbalized understanding and is agreeable to plan. Final Clinical Impressions(s) / UC Diagnoses   Final diagnoses:  URI with cough and congestion     Discharge Instructions     Tessalon for cough. Start flonase, atrovent nasal spray for nasal congestion/drainage. You can use over the counter nasal saline rinse such as neti pot for nasal congestion. Keep hydrated, your urine should be clear to pale yellow in color. Tylenol/motrin for fever and pain. Monitor for any worsening of symptoms, chest pain, shortness of breath, wheezing, swelling of the throat, go to the emergency department for further evaluation needed.     ED Prescriptions    Medication Sig Dispense Auth. Provider   benzonatate (TESSALON) 100 MG capsule Take 1 capsule (100 mg total) by mouth every 8 (eight) hours. 21 capsule Hall-Potvin, [place], PA-C   cetirizine (ZYRTEC ALLERGY) 10 MG tablet Take 1 tablet (10 mg total) by mouth daily. 30 tablet Hall-Potvin, [place], PA-C   fluticasone (FLONASE) 50 MCG/ACT nasal spray Place 1 spray into both nostrils daily. 16 g Hall-Potvin, [place], PA-C     PDMP not reviewed this encounter.   Hall-Potvin, [place], [place] 11/08/20 1451

## 2021-09-18 LAB — HM PAP SMEAR

## 2021-11-19 ENCOUNTER — Ambulatory Visit
Admission: EM | Admit: 2021-11-19 | Discharge: 2021-11-19 | Disposition: A | Discharge status: Home / Self Care | Payer: Medicaid Other

## 2021-11-19 ENCOUNTER — Other Ambulatory Visit: Payer: Self-pay

## 2021-11-19 ENCOUNTER — Encounter: Payer: Self-pay | Admitting: Emergency Medicine

## 2021-11-19 DIAGNOSIS — J069 Acute upper respiratory infection, unspecified: Secondary | ICD-10-CM

## 2021-11-19 LAB — POCT INFLUENZA A/B
Influenza A, POC: NEGATIVE
Influenza B, POC: NEGATIVE

## 2021-11-19 NOTE — ED Provider Notes (Signed)
[facility]    CSN: [id number] Arrival date & time: 11/19/21  4332      History   Chief Complaint Chief Complaint  Patient presents with   Cough    HPI Candice Howard is a 21 y.o. female.   Patient here today for evaluation of cough, body aches, chills and sore throat that she has had for 2 days.  She has tried taking over-the-counter medication without significant relief.  She has not had any nausea, vomiting or diarrhea.  The history is provided by the patient.   Past Medical History:  Diagnosis Date   Headache     Patient Active Problem List   Diagnosis Date Noted   Chronic daily headache 08/09/2016   Severe single current episode of major depressive disorder, without psychotic features (HCC) 08/09/2016   Anxiety state 08/09/2016   Sleep disorder 08/09/2016    Past Surgical History:  Procedure Laterality Date   CHALAZION EXCISION Left 01/05/2019   Procedure: EXCISION CHALAZION LEFT LOWER LID;  Surgeon: French Ana, MD;  Location: [facility];  Service: Ophthalmology;  Laterality: Left;   NO PAST SURGERIES      OB History   No obstetric history on file.      Home Medications    Prior to Admission medications   Medication Sig Start Date End Date Taking? Authorizing Provider  LO LOESTRIN FE 1 MG-10 MCG / 10 MCG tablet Take 1 tablet by mouth daily. 11/09/21  Yes [provider]  benzonatate (TESSALON) 100 MG capsule Take 1 capsule (100 mg total) by mouth every 8 (eight) hours. 11/08/20   Hall-Potvin, [place], PA-C  cetirizine (ZYRTEC ALLERGY) 10 MG tablet Take 1 tablet (10 mg total) by mouth daily. 11/08/20   Hall-Potvin, [place], PA-C  fluticasone (FLONASE) 50 MCG/ACT nasal spray Place 1 spray into both nostrils daily. 11/08/20   Hall-Potvin, [place], PA-C  ibuprofen (ADVIL) 200 MG tablet Take by mouth.    [provider]    Family History Family History  Problem Relation Age of Onset   Migraines Mother     Headache Father    Alcohol abuse Maternal Grandmother    Alcohol abuse Paternal Grandfather    Seizures Neg Hx    Depression Neg Hx    Anxiety disorder Neg Hx    Bipolar disorder Neg Hx    Schizophrenia Neg Hx    ADD / ADHD Neg Hx    Autism Neg Hx    Drug abuse Neg Hx     Social History Social History   Tobacco Use   Smoking status: Never    Passive exposure: Yes   Smokeless tobacco: Never   Tobacco comments:    Mother smokes outside  Vaping Use   Vaping Use: Never used  Substance Use Topics   Alcohol use: Never   Drug use: Never     Allergies   Patient has no known allergies.   Review of Systems Review of Systems  Constitutional:  Positive for chills. Negative for fever.  HENT:  Positive for congestion and sore throat. Negative for ear pain.   Eyes:  Negative for discharge and redness.  Respiratory:  Positive for cough. Negative for shortness of breath and wheezing.   Gastrointestinal:  Negative for abdominal pain, diarrhea, nausea and vomiting.  Musculoskeletal:  Positive for myalgias.    Physical Exam Triage Vital Signs ED Triage Vitals  Enc Vitals Group     BP      Pulse  Resp      Temp      Temp src      SpO2      Weight      Height      Head Circumference      Peak Flow      Pain Score      Pain Loc      Pain Edu?      Excl. in [place]?    No data found.  Updated Vital Signs BP 126/73 (BP Location: Left Arm)    Pulse 77    Temp 99.5 F (37.5 C) (Oral)    Ht 5\' 3"  (1.6 m)    Wt 132 lb 0.9 oz (59.9 kg)    LMP 10/29/2021    SpO2 98%    BMI 23.39 kg/m       Physical Exam Vitals and nursing note reviewed.  Constitutional:      General: She is not in acute distress.    Appearance: Normal appearance. She is not ill-appearing.  HENT:     Head: Normocephalic and atraumatic.     Nose: Congestion present.     Mouth/Throat:     Mouth: Mucous membranes are moist.     Pharynx: Posterior oropharyngeal erythema present. No oropharyngeal exudate.   Eyes:     Conjunctiva/sclera: Conjunctivae normal.  Cardiovascular:     Rate and Rhythm: Normal rate and regular rhythm.     Heart sounds: Normal heart sounds. No murmur heard. Pulmonary:     Effort: Pulmonary effort is normal. No respiratory distress.     Breath sounds: Normal breath sounds. No wheezing, rhonchi or rales.  Skin:    General: Skin is warm and dry.  Neurological:     Mental Status: She is alert.  Psychiatric:        Mood and Affect: Mood normal.        Thought Content: Thought content normal.     UC Treatments / Results  Labs (all labs ordered are listed, but only abnormal results are displayed) Labs Reviewed  NOVEL CORONAVIRUS, NAA  POCT INFLUENZA A/B    EKG   Radiology No results found.  Procedures Procedures (including critical care time)  Medications Ordered in UC Medications - No data to display  Initial Impression / Assessment and Plan / UC Course  I have reviewed the triage vital signs and the nursing notes.  Pertinent labs & imaging results that were available during my care of the patient were reviewed by me and considered in my medical decision making (see chart for details).    Suspect likely viral etiology of symptoms.  Flu test negative in office, will screen for COVID.  Recommend symptomatic treatment while awaiting results.  Encouraged follow-up with any further concerns.  Final Clinical Impressions(s) / UC Diagnoses   Final diagnoses:  Acute upper respiratory infection   Discharge Instructions   None    ED Prescriptions   None    PDMP not reviewed this encounter.   10/31/2021, PA-C 11/19/21 1120

## 2021-11-19 NOTE — ED Triage Notes (Signed)
Patient c/o productive cough, body aches & chills, sore throat x 2 days.  Patient has been taken Tylenol Cold & Flu.  Patient is not vaccinated for COVID.

## 2021-11-20 LAB — SARS-COV-2, NAA 2 DAY TAT

## 2021-11-20 LAB — NOVEL CORONAVIRUS, NAA: SARS-CoV-2, NAA: NOT DETECTED

## 2022-08-02 ENCOUNTER — Encounter: Payer: Self-pay | Admitting: Physician Assistant

## 2022-08-02 ENCOUNTER — Ambulatory Visit (INDEPENDENT_AMBULATORY_CARE_PROVIDER_SITE_OTHER): Payer: 59 | Admitting: Physician Assistant

## 2022-08-02 VITALS — BP 120/82 | HR 66 | Temp 97.5°F | Ht 64.0 in | Wt 122.4 lb

## 2022-08-02 DIAGNOSIS — Z113 Encounter for screening for infections with a predominantly sexual mode of transmission: Secondary | ICD-10-CM | POA: Diagnosis not present

## 2022-08-02 DIAGNOSIS — B001 Herpesviral vesicular dermatitis: Secondary | ICD-10-CM | POA: Diagnosis not present

## 2022-08-02 DIAGNOSIS — F39 Unspecified mood [affective] disorder: Secondary | ICD-10-CM

## 2022-08-02 DIAGNOSIS — G44229 Chronic tension-type headache, not intractable: Secondary | ICD-10-CM

## 2022-08-02 LAB — CBC WITH DIFFERENTIAL/PLATELET
Basophils Absolute: 0 10*3/uL (ref 0.0–0.1)
Basophils Relative: 0.4 % (ref 0.0–3.0)
Eosinophils Absolute: 0.1 10*3/uL (ref 0.0–0.7)
Eosinophils Relative: 1.8 % (ref 0.0–5.0)
HCT: 37.2 % (ref 36.0–46.0)
Hemoglobin: 12.5 g/dL (ref 12.0–15.0)
Lymphocytes Relative: 23.3 % (ref 12.0–46.0)
Lymphs Abs: 1.4 10*3/uL (ref 0.7–4.0)
MCHC: 33.7 g/dL (ref 30.0–36.0)
MCV: 92.8 fl (ref 78.0–100.0)
Monocytes Absolute: 0.7 10*3/uL (ref 0.1–1.0)
Monocytes Relative: 11.8 % (ref 3.0–12.0)
Neutro Abs: 3.8 10*3/uL (ref 1.4–7.7)
Neutrophils Relative %: 62.7 % (ref 43.0–77.0)
Platelets: 227 10*3/uL (ref 150.0–400.0)
RBC: 4.01 Mil/uL (ref 3.87–5.11)
RDW: 12.7 % (ref 11.5–15.5)
WBC: 6.1 10*3/uL (ref 4.0–10.5)

## 2022-08-02 LAB — IBC + FERRITIN
Ferritin: 37.1 ng/mL (ref 10.0–291.0)
Iron: 58 ug/dL (ref 42–145)
Saturation Ratios: 19.3 % — ABNORMAL LOW (ref 20.0–50.0)
TIBC: 301 ug/dL (ref 250.0–450.0)
Transferrin: 215 mg/dL (ref 212.0–360.0)

## 2022-08-02 LAB — COMPREHENSIVE METABOLIC PANEL
ALT: 4 U/L (ref 0–35)
AST: 13 U/L (ref 0–37)
Albumin: 4.3 g/dL (ref 3.5–5.2)
Alkaline Phosphatase: 49 U/L (ref 39–117)
BUN: 10 mg/dL (ref 6–23)
CO2: 29 mEq/L (ref 19–32)
Calcium: 9.4 mg/dL (ref 8.4–10.5)
Chloride: 103 mEq/L (ref 96–112)
Creatinine, Ser: 0.91 mg/dL (ref 0.40–1.20)
GFR: 89.77 mL/min (ref >60.00)
Glucose, Bld: 91 mg/dL (ref 70–99)
Potassium: 3.5 mEq/L (ref 3.5–5.1)
Sodium: 140 mEq/L (ref 135–145)
Total Bilirubin: 0.4 mg/dL (ref 0.2–1.2)
Total Protein: 7.2 g/dL (ref 6.0–8.3)

## 2022-08-02 LAB — VITAMIN B12: Vitamin B-12: 456 pg/mL (ref 211–911)

## 2022-08-02 LAB — TSH: TSH: 0.33 u[IU]/mL — ABNORMAL LOW (ref 0.35–5.50)

## 2022-08-02 LAB — VITAMIN D 25 HYDROXY (VIT D DEFICIENCY, FRACTURES): VITD: 23.29 ng/mL — ABNORMAL LOW (ref 30.00–100.00)

## 2022-08-02 MED ORDER — VALACYCLOVIR HCL 1 G PO TABS: 1000.0000 mg | ORAL_TABLET | Freq: Two times a day (BID) | ORAL | 0 refills | Status: DC

## 2022-08-02 MED ORDER — LO LOESTRIN FE 1 MG-10 MCG / 10 MCG PO TABS: 1.0000 | ORAL_TABLET | Freq: Every day | ORAL | 3 refills | Status: AC

## 2022-08-02 NOTE — Progress Notes (Signed)
Candice Howard is a 21 y.o. female here for a follow up of a pre-existing problem.  History of Present Illness:   Chief Complaint  Patient presents with  . Establish Care  . 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 Presently working from home.    HPI  Depression and Anxiety She was being seen by prior PCP up until last month. She was receiving work accommodations through them to be able to work from home. This has helped her significantly. 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   Was first diagnosed in 2023. But has had symptoms and issues since. Feels very "down" and uncontrolled. She was initially prescribed Lexapro for her anxiety and depression but she had diarrhea with this so she was switched to Zoloft.  She is currently prescribed Zoloft 50 mg daily but reports that she is not taking this regularly.  She reports that when she takes Zoloft she feels like it makes her symptoms worse.  She reports that her prior prescriber told her that she had bipolar disorder type I.  I do not see this in the records.  She was also doing talk therapy.  She only did therapy once and was not unable to reach out to her therapist to schedule future appointments.  Suicide attempt -- tried to overdose at age 64 when she found that she had herpes.   Herpes Diagnosed at age 34.  She has had significant outbreaks to the point where she is now up to 2 g daily of medication for her HSV.   Chronic tension type headaches Has had headaches for most of her life.  She has seen pediatric neurology.  They did an evaluation for her and said that her symptoms were likely related to possible anemia, constipation, anxiety and depression.  She takes Excedrin Migraine but this does not help her symptoms.      08/02/2022    8:27 AM  Depression screen PHQ 2/9  Decreased Interest 1  Down, Depressed, Hopeless 3  PHQ - 2 Score 4   Altered sleeping 3  Tired, decreased energy 3  Change in appetite 3  Feeling bad or failure about yourself  1  Trouble concentrating 2  Moving slowly or fidgety/restless 2  Suicidal thoughts 0  PHQ-9 Score 18  Difficult doing work/chores Somewhat difficult      08/02/2022    8:27 AM  GAD 7 : Generalized Anxiety Score  Nervous, Anxious, on Edge 3  Control/stop worrying 3  Worry too much - different things 3  Trouble relaxing 3  Restless 2  Easily annoyed or irritable 3  Afraid - awful might happen 2  Total GAD 7 Score 19  Anxiety Difficulty Very difficult      Past Medical History:  Diagnosis Date  . Anxiety   . Depression   . Headache   . Herpes 2022   Type 1 & 2     Social History   Tobacco Use  . Smoking status: Never    Passive exposure: Yes  . Smokeless tobacco: Never  . Tobacco comments:    Mother smokes outside  Vaping Use  . Vaping Use: Never used  Substance Use Topics  . Alcohol use: Never  . Drug use: Never    Past Surgical History:  Procedure Laterality Date  . CHALAZION EXCISION Left 01/05/2019   Procedure: EXCISION CHALAZION LEFT LOWER LID;  Surgeon: French Ana,  MD;  Location: [facility];  Service: Ophthalmology;  Laterality: Left;    Family History  Problem Relation Age of Onset  . Migraines Mother   . Headache Father   . Alcohol abuse Maternal Grandmother   . Alcohol abuse Paternal Grandfather   . Seizures Neg Hx   . Depression Neg Hx   . Anxiety disorder Neg Hx   . Bipolar disorder Neg Hx   . Schizophrenia Neg Hx   . ADD / ADHD Neg Hx   . Autism Neg Hx   . Drug abuse Neg Hx     No Known Allergies  Current Medications:   Current Outpatient Medications:  .  cetirizine (ZYRTEC ALLERGY) 10 MG tablet, Take 1 tablet (10 mg total) by mouth daily., Disp: 30 tablet, Rfl: 0 .  ibuprofen (ADVIL) 200 MG tablet, Take by mouth., Disp: , Rfl:  .  LO LOESTRIN FE 1 MG-10 MCG / 10 MCG tablet, Take 1 tablet by mouth  daily., Disp: 84 tablet, Rfl: 3 .  valACYclovir (VALTREX) 1000 MG tablet, Take 1 tablet (1,000 mg total) by mouth 2 (two) times daily., Disp: 180 tablet, Rfl: 0   Review of Systems:   ROS Negative unless otherwise specified per HPI.  Vitals:   Vitals:   08/02/22 0821  BP: 120/82  Pulse: 66  Temp: (!) 97.5 F (36.4 C)  TempSrc: Temporal  SpO2: 97%  Weight: 122 lb 6.1 oz (55.5 kg)  Height: 5\' 4"  (1.626 m)     Body mass index is 21.01 kg/m.  Physical Exam:   Physical Exam Vitals and nursing note reviewed.  Constitutional:      General: She is not in acute distress.    Appearance: She is well-developed. She is not ill-appearing or toxic-appearing.  Cardiovascular:     Rate and Rhythm: Normal rate and regular rhythm.     Pulses: Normal pulses.     Heart sounds: Normal heart sounds, S1 normal and S2 normal.  Pulmonary:     Effort: Pulmonary effort is normal.     Breath sounds: Normal breath sounds.  Skin:    General: Skin is warm and dry.  Neurological:     Mental Status: She is alert.     GCS: GCS eye subscore is 4. GCS verbal subscore is 5. GCS motor subscore is 6.  Psychiatric:        Speech: Speech normal.        Behavior: Behavior normal. Behavior is cooperative.     Assessment and Plan:   Recurrent herpes labialis Given her need for significant medication to suppress this, I will refer her to gynecology for further full evaluation and discussion of management  Chronic tension-type headache, not intractable No red flags Reviewed her prior neurology note Efforts at healthy lifestyle and getting mental health back on track If new or worsening symptoms, please let us know  Mood disorder ([place]) We will refer to psychiatry for further evaluation, diagnosis and management Will also place talk therapy referral I told her that I can do accommodations for her to work at home but any further accommodations need to come from these providers I discussed with patient  that if they develop any SI, to tell someone immediately and seek medical attention.  Screening examination for STD (sexually transmitted disease) Completed today   Time spent with patient today was 55 minutes which consisted of chart review, discussing diagnosis, work up, completion of forms, treatment answering questions and documentation.  Inda Coke,  PA-C

## 2022-08-02 NOTE — Patient Instructions (Addendum)
It was great to see you!  Referral to Psychiatry and [phone]  Referral to new gynecologist to help you get your herpes under better control  Referral to [facility] for tonsil removal discussion  I will complete your paperwork and fax by end of day TOMORROW  Follow-up based on blood work results -- I will message you about this.

## 2022-08-03 ENCOUNTER — Other Ambulatory Visit: Payer: Self-pay | Admitting: Physician Assistant

## 2022-08-03 ENCOUNTER — Other Ambulatory Visit (INDEPENDENT_AMBULATORY_CARE_PROVIDER_SITE_OTHER): Payer: 59

## 2022-08-03 DIAGNOSIS — R7989 Other specified abnormal findings of blood chemistry: Secondary | ICD-10-CM

## 2022-08-03 DIAGNOSIS — R946 Abnormal results of thyroid function studies: Secondary | ICD-10-CM

## 2022-08-03 LAB — RPR: RPR Ser Ql: NONREACTIVE

## 2022-08-03 LAB — HIV ANTIBODY (ROUTINE TESTING W REFLEX): HIV 1&2 Ab, 4th Generation: NONREACTIVE

## 2022-08-03 LAB — T4, FREE: Free T4: 0.77 ng/dL (ref 0.60–1.60)

## 2022-08-03 LAB — T3, FREE: T3, Free: 3.7 pg/mL (ref 2.3–4.2)

## 2022-08-12 ENCOUNTER — Telehealth: Payer: Self-pay | Admitting: Physician Assistant

## 2022-08-12 NOTE — Telephone Encounter (Signed)
Please see message below

## 2022-08-12 NOTE — Telephone Encounter (Signed)
Pt states: -forms were given to provider at 08/02/22 visit.  -forms are for work/accommodations.   Pt requests: -call back with update about forms.

## 2022-08-13 NOTE — Telephone Encounter (Signed)
Spoke to pt told her Accommodation paperwork was faxed again. I did have a confirmation from the first time I faxed them I am not sure why they did not have them. Pt verbalized understanding.

## 2022-08-26 ENCOUNTER — Encounter: Payer: Self-pay | Admitting: Physician Assistant

## 2024-10-09 ENCOUNTER — Ambulatory Visit
Admission: EM | Admit: 2024-10-09 | Discharge: 2024-10-09 | Disposition: A | Discharge status: Home / Self Care | Payer: Self-pay | Attending: Family Medicine | Admitting: Family Medicine

## 2024-10-09 DIAGNOSIS — J988 Other specified respiratory disorders: Secondary | ICD-10-CM

## 2024-10-09 DIAGNOSIS — B9789 Other viral agents as the cause of diseases classified elsewhere: Secondary | ICD-10-CM

## 2024-10-09 DIAGNOSIS — Z760 Encounter for issue of repeat prescription: Secondary | ICD-10-CM

## 2024-10-09 LAB — POC COVID19/FLU A&B COMBO
Covid Antigen, POC: NEGATIVE
Influenza A Antigen, POC: NEGATIVE
Influenza B Antigen, POC: NEGATIVE

## 2024-10-09 MED ORDER — VALACYCLOVIR HCL 500 MG PO TABS: 500.0000 mg | ORAL_TABLET | Freq: Every day | ORAL | 0 refills | Status: AC | PRN

## 2024-10-09 MED ORDER — PSEUDOEPHEDRINE HCL 30 MG PO TABS: 30.0000 mg | ORAL_TABLET | Freq: Three times a day (TID) | ORAL | 0 refills | Status: AC | PRN

## 2024-10-09 MED ORDER — CETIRIZINE HCL 10 MG PO TABS: 10.0000 mg | ORAL_TABLET | Freq: Every day | ORAL | 0 refills | Status: AC

## 2024-10-09 MED ORDER — BENZONATATE 100 MG PO CAPS: 100.0000 mg | ORAL_CAPSULE | Freq: Three times a day (TID) | ORAL | 0 refills | Status: AC | PRN

## 2024-10-09 MED ORDER — IBUPROFEN 600 MG PO TABS: 600.0000 mg | ORAL_TABLET | Freq: Four times a day (QID) | ORAL | 0 refills | Status: AC | PRN

## 2024-10-09 NOTE — Discharge Instructions (Addendum)
 We will manage this as a viral illness. For sore throat or cough try using a honey-based tea. Use 3 teaspoons of honey with juice squeezed from half lemon. Place shaved pieces of ginger into 1/2-1 cup of water and warm over stove top. Then mix the ingredients and repeat every 4 hours as needed. Please take ibuprofen  600mg  every 6 hours with food alternating with OR taken together with Tylenol  500mg -650mg  every 6 hours for throat pain, fevers, aches and pains. Hydrate very well with at least 2 liters of water. Eat light meals such as soups (chicken and noodles, vegetable, chicken and wild rice).  Do not eat foods that you are allergic to.  Taking an antihistamine like Zyrtec  (10mg  daily) can help against postnasal drainage, sinus congestion which can cause sinus pain, sinus headaches, throat pain, painful swallowing, coughing.  You can take this together with pseudoephedrine  (Sudafed) at a dose of 30 mg 3 times a day or twice daily as needed for the same kind of nasal drip, congestion.  Use cough capsules as needed.

## 2024-10-09 NOTE — ED Triage Notes (Signed)
 Pt c/o cough, head/chest congestion, fever, chills, body aches-taking mucinex-no fever meds-states fever was 101 today-NAD-steady gait

## 2024-10-09 NOTE — ED Provider Notes (Signed)
 [facility]  Note:  This document was prepared using Conservation officer, historic buildings and may include unintentional dictation errors.  MRN: [id number] DOB: November 23, 2000  Subjective:   Candice Howard is a 23 y.o. female presenting for 2-day history of acute onset malaise, fatigue, fever, chills, body aches, sinus congestion, coughing.  Reports loss of taste.  No throat pain, chest pain, shortness of breath or wheezing.  No asthma.  No smoking of any kind including cigarettes, cigars, vaping, marijuana use.    No current facility-administered medications for this encounter.  Current Outpatient Medications:    cetirizine  (ZYRTEC  ALLERGY) 10 MG tablet, Take 1 tablet (10 mg total) by mouth daily., Disp: 30 tablet, Rfl: 0   ibuprofen  (ADVIL ) 200 MG tablet, Take by mouth., Disp: , Rfl:    LO LOESTRIN FE  1 MG-10 MCG / 10 MCG tablet, Take 1 tablet by mouth daily., Disp: 84 tablet, Rfl: 3   valACYclovir  (VALTREX ) 1000 MG tablet, Take 1 tablet (1,000 mg total) by mouth 2 (two) times daily., Disp: 180 tablet, Rfl: 0   No Known Allergies  Past Medical History:  Diagnosis Date   Anxiety    Depression    Headache    Herpes 2022   Type 1 & 2     Past Surgical History:  Procedure Laterality Date   CHALAZION EXCISION Left 01/05/2019   Procedure: EXCISION CHALAZION LEFT LOWER LID;  Surgeon: Tobie Factor, MD;  Location: [facility];  Service: Ophthalmology;  Laterality: Left;    Family History  Problem Relation Age of Onset   Migraines Mother    Headache Father    Alcohol abuse Maternal Grandmother    Alcohol abuse Paternal Grandfather    Seizures Neg Hx    Depression Neg Hx    Anxiety disorder Neg Hx    Bipolar disorder Neg Hx    Schizophrenia Neg Hx    ADD / ADHD Neg Hx    Autism Neg Hx    Drug abuse Neg Hx     Social History   Tobacco Use   Smoking status: Never    Passive exposure: Yes   Smokeless tobacco: Never   Tobacco  comments:    Mother smokes outside  Vaping Use   Vaping status: Never Used  Substance Use Topics   Alcohol use: Yes    Comment: occ   Drug use: Never    ROS   Objective:   Vitals: BP 113/74 (BP Location: Right Arm)   Pulse 87   Temp 99.1 F (37.3 C) (Oral)   Resp 16   LMP 09/22/2024   SpO2 98%   Physical Exam Constitutional:      General: She is not in acute distress.    Appearance: Normal appearance. She is well-developed and normal weight. She is not ill-appearing, toxic-appearing or diaphoretic.  HENT:     Head: Normocephalic and atraumatic.     Right Ear: Tympanic membrane, ear canal and external ear normal. No drainage or tenderness. No middle ear effusion. There is no impacted cerumen. Tympanic membrane is not erythematous or bulging.     Left Ear: Tympanic membrane, ear canal and external ear normal. No drainage or tenderness.  No middle ear effusion. There is no impacted cerumen. Tympanic membrane is not erythematous or bulging.     Nose: Nose normal. No congestion or rhinorrhea.     Mouth/Throat:     Mouth: Mucous membranes are moist. No oral lesions.  Pharynx: No pharyngeal swelling, oropharyngeal exudate, posterior oropharyngeal erythema or uvula swelling.     Tonsils: No tonsillar exudate or tonsillar abscesses.  Eyes:     General: No scleral icterus.       Right eye: No discharge.        Left eye: No discharge.     Extraocular Movements: Extraocular movements intact.     Right eye: Normal extraocular motion.     Left eye: Normal extraocular motion.     Conjunctiva/sclera: Conjunctivae normal.  Cardiovascular:     Rate and Rhythm: Normal rate and regular rhythm.     Heart sounds: Normal heart sounds. No murmur heard.    No friction rub. No gallop.  Pulmonary:     Effort: Pulmonary effort is normal. No respiratory distress.     Breath sounds: No stridor. No wheezing, rhonchi or rales.  Chest:     Chest wall: No tenderness.  Musculoskeletal:      Cervical back: Normal range of motion and neck supple.  Lymphadenopathy:     Cervical: No cervical adenopathy.  Skin:    General: Skin is warm and dry.  Neurological:     General: No focal deficit present.     Mental Status: She is alert and oriented to person, place, and time.  Psychiatric:        Mood and Affect: Mood normal.        Behavior: Behavior normal.     Results for orders placed or performed during the hospital encounter of 10/09/24 (from the past 24 hours)  POC Covid19/Flu A&B Antigen     Status: Normal   Collection Time: 10/09/24  5:49 PM  Result Value Ref Range   Influenza A Antigen, POC Negative Negative   Influenza B Antigen, POC Negative Negative   Covid Antigen, POC Negative Negative    Assessment and Plan :   PDMP not reviewed this encounter.  1. Viral respiratory infection   2. Encounter for medication refill    Suspect viral URI, viral syndrome. Physical exam findings reassuring and vital signs stable for discharge. Advised supportive care, offered symptomatic relief. Counseled patient on potential for adverse effects with medications prescribed/recommended today, ER and return-to-clinic precautions discussed, patient verbalized understanding.    At discharge, patient requested long-term medication refill for her valacyclovir  for her genital herpes.  Has taken this for quite some time.  For HSV suppression.  Has a PCP.  Has taken this long-term.  I offered a 30-day refill.  Recommended follow-up with her PCP for continued refills as this medication is not recommended to be taken indefinitely.   Christopher Savannah, [place] 10/09/24 1807
